# Patient Record
Sex: Female | Born: 1995 | Race: Black or African American | Hispanic: No | Marital: Single | Smoking: Never smoker
Health system: Southern US, Community
[De-identification: ages and names within clinical notes are randomized; demographics above are authoritative.]

## PROBLEM LIST (undated history)

## (undated) DIAGNOSIS — J45990 Exercise induced bronchospasm: Secondary | ICD-10-CM

## (undated) DIAGNOSIS — F419 Anxiety disorder, unspecified: Secondary | ICD-10-CM

## (undated) DIAGNOSIS — F410 Panic disorder [episodic paroxysmal anxiety] without agoraphobia: Secondary | ICD-10-CM

## (undated) HISTORY — PX: NO PAST SURGERIES: SHX2092

---
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
## 2013-04-14 ENCOUNTER — Ambulatory Visit: Payer: Self-pay

## 2013-04-14 LAB — URINALYSIS, COMPLETE
Bilirubin,UR: NEGATIVE
Glucose,UR: NEGATIVE mg/dL (ref 0–75)
Ketone: NEGATIVE
Nitrite: NEGATIVE
Ph: 6.5 (ref 4.5–8.0)
Specific Gravity: 1.01 (ref 1.003–1.030)
WBC UR: >30 /HPF (ref 0–5)

## 2013-04-14 LAB — PREGNANCY, URINE: Pregnancy Test, Urine: NEGATIVE m[IU]/mL

## 2013-04-16 LAB — URINE CULTURE

## 2013-04-30 ENCOUNTER — Emergency Department: Payer: Self-pay | Admitting: Emergency Medicine

## 2013-04-30 LAB — CBC
HCT: 37.7 % (ref 35.0–47.0)
MCH: 28.1 pg (ref 26.0–34.0)
MCHC: 34.9 g/dL (ref 32.0–36.0)
MCV: 81 fL (ref 80–100)
Platelet: 353 10*3/uL (ref 150–440)
RBC: 4.68 10*6/uL (ref 3.80–5.20)
RDW: 12.6 % (ref 11.5–14.5)

## 2013-04-30 LAB — COMPREHENSIVE METABOLIC PANEL
Albumin: 3.3 g/dL — ABNORMAL LOW (ref 3.8–5.6)
Anion Gap: 5 — ABNORMAL LOW (ref 7–16)
BUN: 7 mg/dL (ref 7–18)
Calcium, Total: 9.5 mg/dL (ref 9.0–10.7)
Chloride: 104 mmol/L (ref 98–107)
Co2: 29 mmol/L (ref 21–32)
EGFR (Non-African Amer.): >60
Glucose: 77 mg/dL (ref 65–99)
Osmolality: 272 (ref 275–301)
SGOT(AST): 17 U/L (ref 0–26)
Sodium: 138 mmol/L (ref 136–145)

## 2013-04-30 LAB — URINALYSIS, COMPLETE
Bacteria: NONE SEEN
Ketone: NEGATIVE
Nitrite: NEGATIVE
Protein: NEGATIVE
RBC,UR: 7 /HPF (ref 0–5)
Specific Gravity: 1.017 (ref 1.003–1.030)
Squamous Epithelial: 6
WBC UR: 9 /HPF (ref 0–5)

## 2013-04-30 LAB — LIPASE, BLOOD: Lipase: 129 U/L (ref 73–393)

## 2014-03-14 IMAGING — US ABDOMEN ULTRASOUND LIMITED
1 series · 14 of 25 positions shown · non-contrast
Comparison: none

REASON FOR EXAM: RUQ pain
COMMENTS:   Body Site: GB and Fossa, CBD, Head of Pancreas

[Series 1: abdomen ultrasound limited · 0.23mm/px · 14 of 51 slices shown]
[im 1/51]
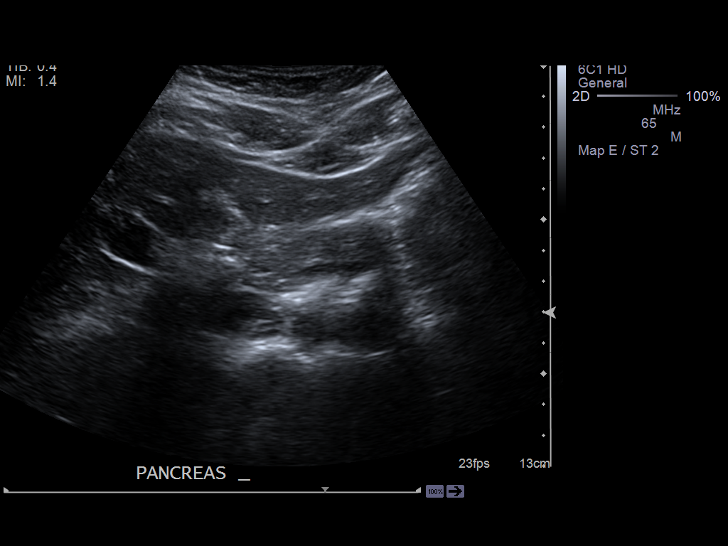
[im 5/51]
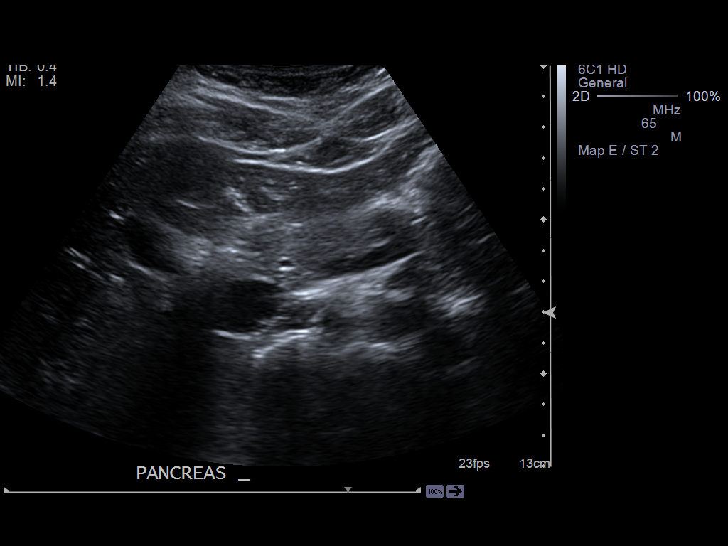
[im 9/51]
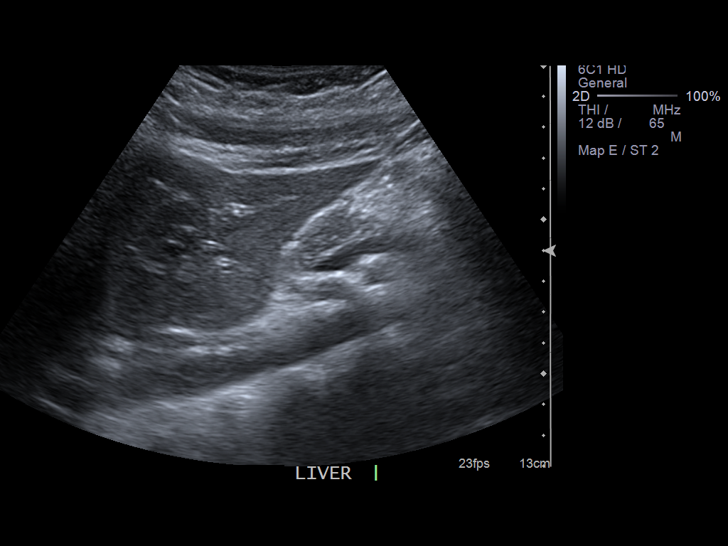
[im 13/51]
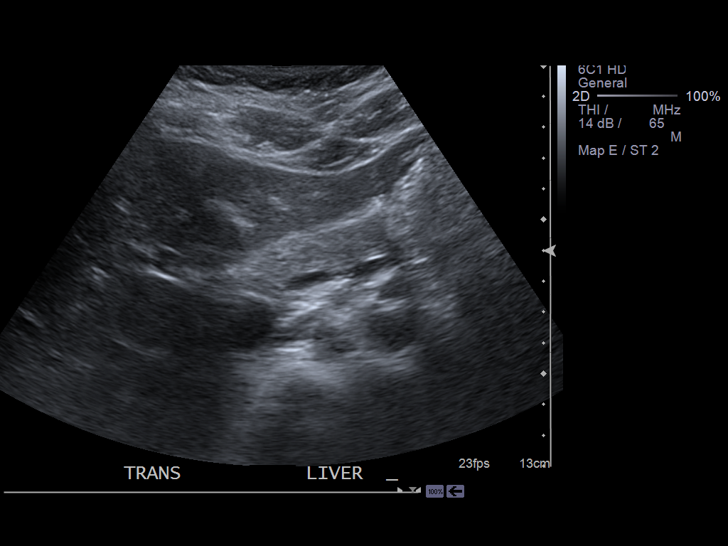
[im 17/51]
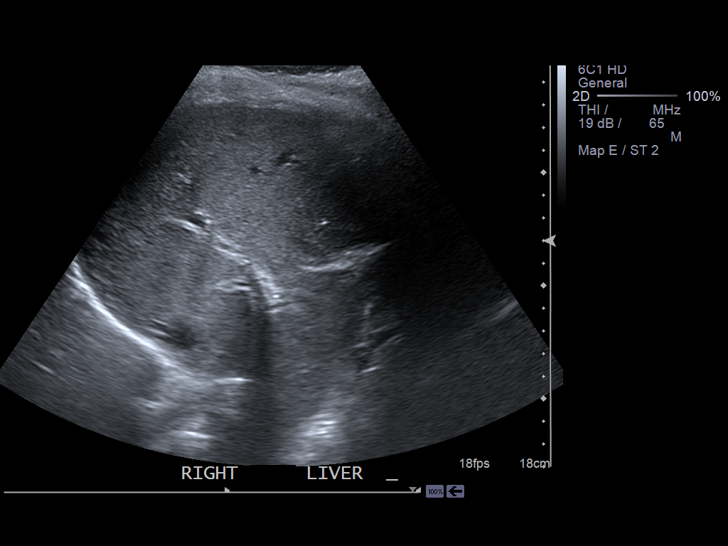
[im 19/51]
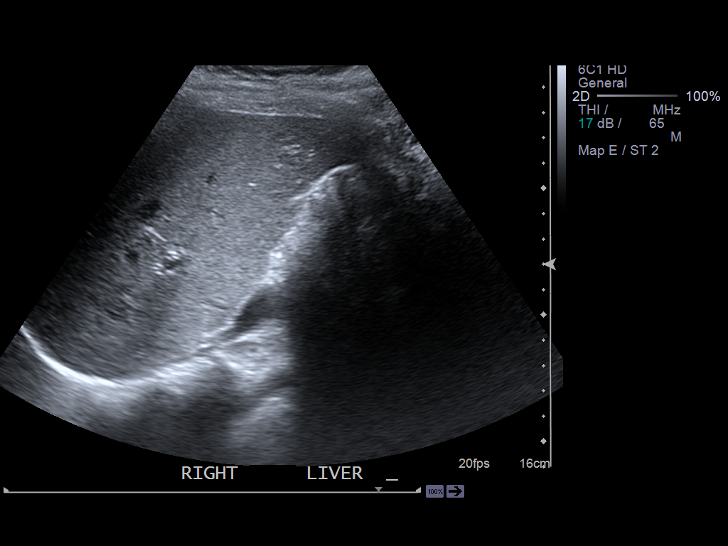
[im 23/51]
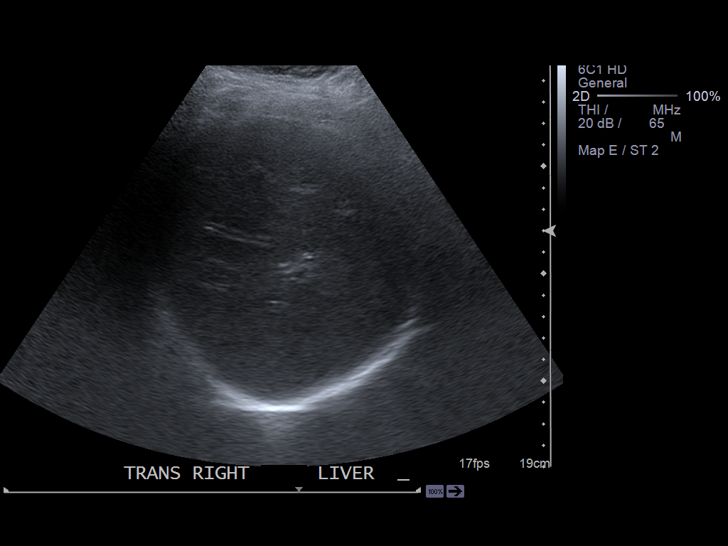
[im 28/51]
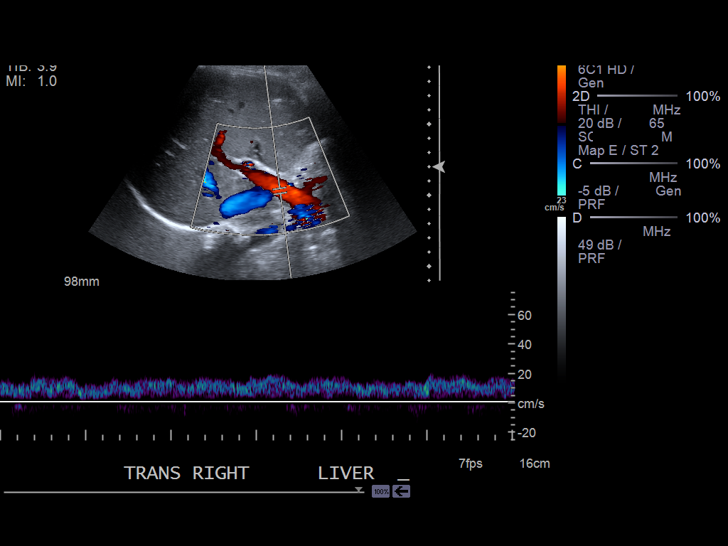
[im 32/51]
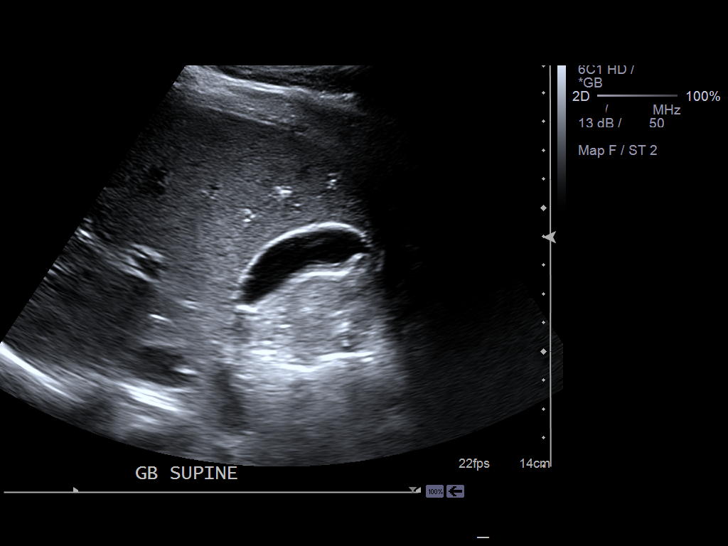
[im 34/51]
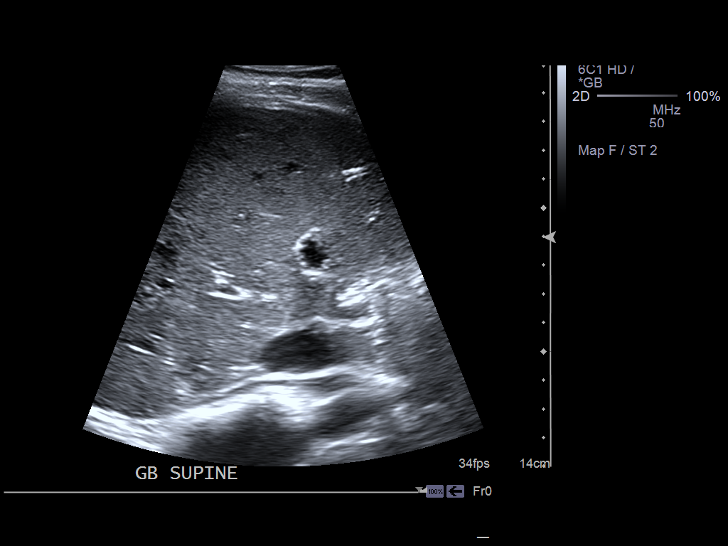
[im 38/51]
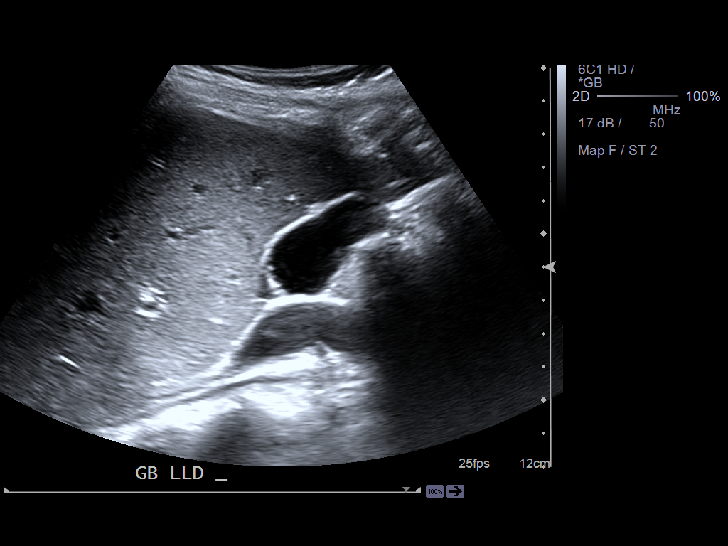
[im 42/51]
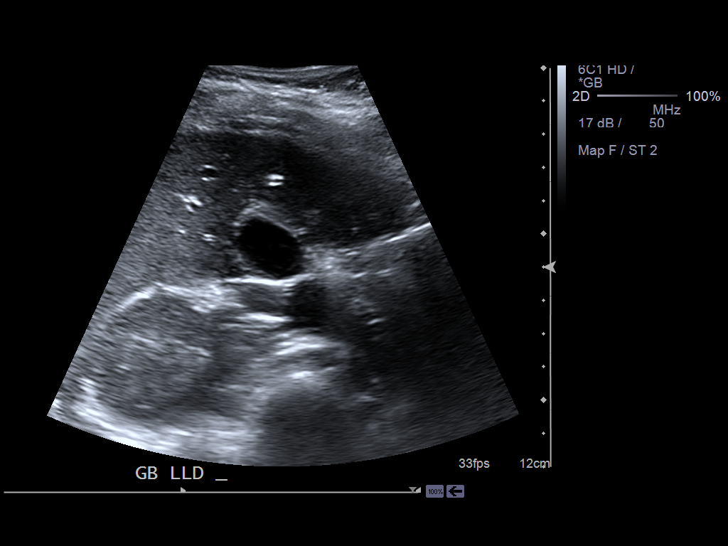
[im 46/51]
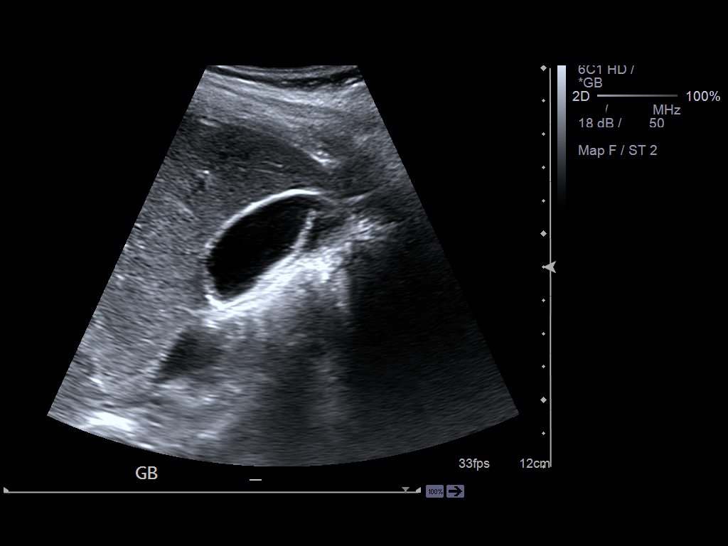
[im 51/51]
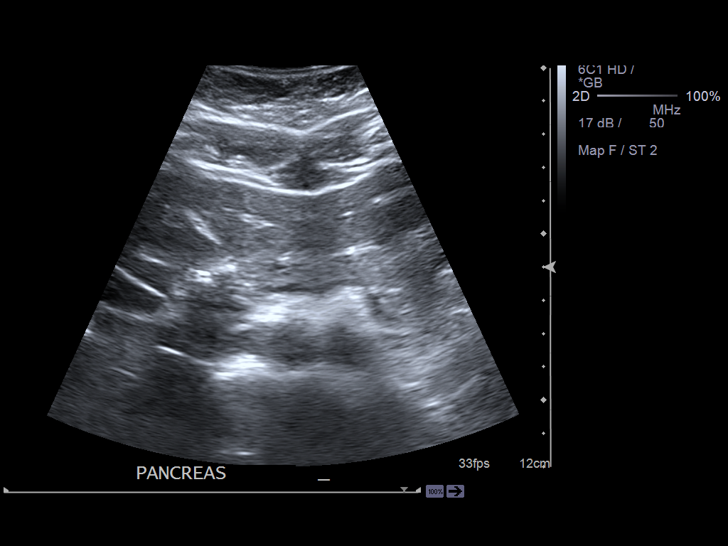

[14 of 25 positions shown; findings below may reference images not displayed]

PROCEDURE:     US  - US ABDOMEN LIMITED SURVEY  - April 30, 2013  [DATE]

RESULT:     The liver exhibits normal echotexture with no focal mass nor
ductal dilation. Portal venous flow is normal in direction toward the liver.
The pancreas exhibits normal echotexture and contour with no evidence of a
mass or ductal dilation. The gallbladder is adequately distended with no
evidence of stones, wall thickening, or pericholecystic fluid. There is no
positive sonographic Murphy's sign. The common bile duct is normal at 2.1 mm
in diameter.
IMPRESSION: Normal limited exam of the liver, pancreas, gallbladder,
and common bile duct.

[REDACTED]

## 2016-11-07 ENCOUNTER — Ambulatory Visit
Admission: EM | Admit: 2016-11-07 | Discharge: 2016-11-07 | Disposition: A | Discharge status: Home / Self Care | Payer: 59 | Attending: Family Medicine | Admitting: Family Medicine

## 2016-11-07 DIAGNOSIS — K5900 Constipation, unspecified: Secondary | ICD-10-CM

## 2016-11-07 MED ORDER — DOCUSATE SODIUM 100 MG PO CAPS: 100.0000 mg | ORAL_CAPSULE | Freq: Two times a day (BID) | ORAL | 0 refills | Status: AC

## 2016-11-07 NOTE — Discharge Instructions (Signed)
Take medication as prescribed. Take over the counter fiber/metamucil as discussed. Drink plenty of fluids.   Follow up with your primary care physician this week as needed. Return to Urgent care for new or worsening concerns.

## 2016-11-07 NOTE — ED Provider Notes (Signed)
[facility] ____________________________________________  Time seen: Approximately 9:59 AM  I have reviewed the triage vital signs and the nursing notes.   HISTORY  Chief Complaint Abdominal Pain  HPI Kaitlyn Fox is a 21 y.o. female  presenting for the complaints of abdominal discomfort and constipation. Patient reports yesterday morning she was having generalized abdominal pain, fullness and gas feeling that resolved after having a bowel movement. Patient reports yesterday evening she did have a bowel movement after straining for over an hour and taking milk of magnesia. Patient reports bowel movement was large, hard, normal coloration. Patient reports she has a history of chronic constipation with her bowel movement pattern every approximately 3 days, and reports this been her pattern since childhood. Patient reports never been previously evaluated the same complaint. Denies nausea vomiting or diarrhea. Reports continues to pass gas normally.   Patient reports after bowel movement her abdominal pain resolved except for having some pain to her right upper side, that is only present with movement. Patient reports straining during bowel movement. Patient does report yesterday after bowel movement when she wiped there was a small amount of bright red blood on the tissue, denies blood in stool or toiler or dark color stool. Patient reports last week she did have a bowel movement after straining that she noticed what appeared to be blood in the stool, denies black stool. Denies other blood in stool. Denies any other bleeding.  Denies any urinary frequency, urinary urgency or burning with urination. Denies vaginal discharge, vaginal odor or vaginal complaints. Reports sexually active with one partner, declines concerns of STDs. Patient reports that she did take over-the-counter milk of magnesia yesterday which assisted with bowel movement, denies taking other medications  over-the-counter. Reports continues to drink fluids well denies recent dietary changes.Reports has continued to remain active.   Denies chest pain, shortness of breath, dysuria, extremity pain, extremity swelling or rash. Denies recent sickness. Denies recent antibiotic use.   Patient's last menstrual period was 10/17/2016.Denies pregnancy  History reviewed. No pertinent past medical history.  Constipation  There are no active problems to display for this patient.   History reviewed. No pertinent surgical history.   No current facility-administered medications for this encounter.   Current Outpatient Prescriptions:  .  norethindrone (MICRONOR,CAMILA,ERRIN) 0.35 MG tablet, Take 1 tablet by mouth daily., Disp: , Rfl:  .  docusate sodium (COLACE) 100 MG capsule, Take 1 capsule (100 mg total) by mouth every 12 (twelve) hours., Disp: 20 capsule, Rfl: 0  Allergies Patient has no known allergies.  History reviewed. No pertinent family history.  Social History Social History  Substance Use Topics  . Smoking status: Never Smoker  . Smokeless tobacco: Never Used  . Alcohol use No    Review of Systems Constitutional: No fever/chills Eyes: No visual changes. ENT: No sore throat. Cardiovascular: Denies chest pain. Respiratory: Denies shortness of breath. Gastrointestinal: As above. No nausea, no vomiting.  No diarrhea.   Genitourinary: Negative for dysuria. Musculoskeletal: Negative for back pain. Skin: Negative for rash. Neurological: Negative for headaches, focal weakness or numbness.  10-point ROS otherwise negative.  ____________________________________________   PHYSICAL EXAM:  VITAL SIGNS: ED Triage Vitals  Enc Vitals Group     BP 11/07/16 0942 (!) 138/97     Pulse Rate 11/07/16 0942 86     Resp 11/07/16 0942 18     Temp 11/07/16 0942 98.7 F (37.1 C)     Temp Source 11/07/16 0942 Oral  SpO2 11/07/16 0942 100 %     Weight 11/07/16 0944 160 lb (72.6 kg)      Height 11/07/16 0944 5\' 4"  (1.626 m)     Head Circumference --      Peak Flow --      Pain Score 11/07/16 0946 6     Pain Loc --      Pain Edu? --      Excl. in GC? --     Constitutional: Alert and oriented. Well appearing and in no acute distress. Eyes: Conjunctivae are normal. PERRL. EOMI. ENT      Head: Normocephalic and atraumatic.      Mouth/Throat: Mucous membranes are moist.Oropharynx non-erythematous. Cardiovascular: Normal rate, regular rhythm. Grossly normal heart sounds.  Good peripheral circulation. Respiratory: Normal respiratory effort without tachypnea nor retractions. Breath sounds are clear and equal bilaterally. No wheezes, rales, rhonchi. Gastrointestinal: Soft and nontender. No distention. Normal Bowel sounds. No CVA tenderness. Musculoskeletal: Steady gait. No midline cervical, thoracic or lumbar tenderness to palpation. Right upper lateral side mild pain with twisting and overhead reaching, no pain when still, no pain with lying and palpated.  Neurologic:  Normal speech and language. Speech is normal. No gait instability.  Skin:  Skin is warm, dry and intact. No rash noted. Psychiatric: Mood and affect are normal. Speech and behavior are normal. Patient exhibits appropriate insight and judgment   ___________________________________________   LABS (all labs ordered are listed, but only abnormal results are displayed)  Labs Reviewed - No data to display ____________________________________________    PROCEDURES Procedures   INITIAL IMPRESSION / ASSESSMENT AND PLAN / ED COURSE  Pertinent labs & imaging results that were available during my care of the patient were reviewed by me and considered in my medical decision making (see chart for details).  Well-appearing patient. No acute distress. Presents for the complaints of what she described of about abdominal discomfort that is present yesterday and resolved with large bowel movement. In discussing with  patient, reports chronic history of constipation. Discussed supportive measures. Also suspect muscle strain, as pain only with movement and non at rest. Discussed the evaluation today, including up to KUB, labs, rectal exam. As good bowel sounds, and no abdominal pain at this time, defer KUB, patient agreed. Patient also declines lab studies and rectal exam at this time, and states would like to try medication and supportive treatment first and then follow up as needed. Discussed strict follow up and return parameters. Will treat patient with oral Colace, and discuss over-the-counter fiber supplements including Metamucil. Also discussed use of probiotics. Encourage plenty of water and food fiber sources.Discussed indication, risks and benefits of medications with patient.  Discussed follow up with Primary care physician this week. Discussed follow up and return parameters including no resolution or any worsening concerns. Patient verbalized understanding and agreed to plan.   ____________________________________________   FINAL CLINICAL IMPRESSION(S) / ED DIAGNOSES  Final diagnoses:  Constipation, unspecified constipation type     Discharge Medication List as of 11/07/2016 10:30 AM    START taking these medications   Details  docusate sodium (COLACE) 100 MG capsule Take 1 capsule (100 mg total) by mouth every 12 (twelve) hours., Starting Fri 11/07/2016, Until Mon 11/17/2016, Normal        Note: This dictation was prepared with Dragon dictation along with smaller phrase technology. Any transcriptional errors that result from this process are unintentional.         Renford Dills, NP 11/07/16 1121

## 2016-11-07 NOTE — ED Triage Notes (Signed)
Pt c/o stomach pains for the last couple of days. She mentions that she only has a bowel movement every 2-3 days however the last 2 days she has had a BM and they are painful and the stool itself is large and she has some bleeding with the BM. Everything she eats upsets her stomach. Some cramping throughout the day.

## 2017-01-17 ENCOUNTER — Ambulatory Visit
Admission: EM | Admit: 2017-01-17 | Discharge: 2017-01-17 | Disposition: A | Discharge status: Home / Self Care | Payer: 59 | Attending: Emergency Medicine | Admitting: Emergency Medicine

## 2017-01-17 ENCOUNTER — Encounter: Payer: Self-pay | Admitting: Emergency Medicine

## 2017-01-17 DIAGNOSIS — J4521 Mild intermittent asthma with (acute) exacerbation: Secondary | ICD-10-CM

## 2017-01-17 DIAGNOSIS — J40 Bronchitis, not specified as acute or chronic: Secondary | ICD-10-CM

## 2017-01-17 HISTORY — DX: Exercise induced bronchospasm: J45.990

## 2017-01-17 MED ORDER — ALBUTEROL SULFATE HFA 108 (90 BASE) MCG/ACT IN AERS: 2.0000 | INHALATION_SPRAY | RESPIRATORY_TRACT | 0 refills | Status: AC | PRN

## 2017-01-17 MED ORDER — PREDNISONE 10 MG PO TABS: 20.0000 mg | ORAL_TABLET | Freq: Every day | ORAL | 0 refills | Status: AC

## 2017-01-17 MED ORDER — FLUTICASONE PROPIONATE 50 MCG/ACT NA SUSP: 1.0000 | Freq: Every day | NASAL | 2 refills | Status: AC

## 2017-01-17 MED ORDER — BENZONATATE 100 MG PO CAPS: 100.0000 mg | ORAL_CAPSULE | Freq: Three times a day (TID) | ORAL | 0 refills | Status: AC

## 2017-01-17 NOTE — Discharge Instructions (Signed)
Rest,push fluids, take meds as directed. Follow up with PCP in 2-3 days if no improvement, sooner if worse. If you have worsening symptoms with treatment go to ER for further evaluation.

## 2017-01-17 NOTE — ED Provider Notes (Signed)
CSN: [id number]     Arrival date & time 01/17/17  4098 History   First MD Initiated Contact with Patient 01/17/17 [phone]     Chief Complaint  Patient presents with  . Shortness of Breath   (Consider location/radiation/quality/duration/timing/severity/associated sxs/prior Treatment) No language interpreter was used.  Cough  Cough characteristics:  Non-productive and dry Severity:  Mild Onset quality:  Sudden Duration:  3 days Timing:  Constant Progression:  Unchanged Chronicity:  Recurrent Smoker: no   Context: sick contacts, upper respiratory infection and weather changes   Relieved by:  Nothing Worsened by:  Environmental changes Ineffective treatments:  Rest Associated symptoms: shortness of breath, sinus congestion and wheezing   Associated symptoms: no chest pain, no chills, no ear pain, no fever, no headaches, no myalgias, no rash, no rhinorrhea and no sore throat   Associated symptoms comment:  Chest burning w deep cough inspiration Risk factors: recent infection     Past Medical History:  Diagnosis Date  . Exercise-induced asthma    Past Surgical History:  Procedure Laterality Date  . NO PAST SURGERIES     No family history on file. Social History  Substance Use Topics  . Smoking status: Never Smoker  . Smokeless tobacco: Never Used  . Alcohol use No   OB History    No data available     Review of Systems  Constitutional: Negative for chills and fever.  HENT: Positive for congestion, sinus pain and sinus pressure. Negative for ear pain, rhinorrhea, sore throat, trouble swallowing and voice change.   Eyes: Negative.   Respiratory: Positive for cough, shortness of breath and wheezing.   Cardiovascular: Negative for chest pain, palpitations and leg swelling.  Gastrointestinal: Negative for nausea and vomiting.  Endocrine: Negative.   Genitourinary: Negative for dysuria.  Musculoskeletal: Negative for myalgias.  Skin: Negative for rash.   Allergic/Immunologic: Positive for environmental allergies.  Neurological: Negative for headaches.  Hematological: Negative.   Psychiatric/Behavioral: Negative.   All other systems reviewed and are negative.   Allergies  Patient has no known allergies.  Home Medications   Prior to Admission medications   Medication Sig Start Date End Date Taking? Authorizing Provider  albuterol (PROVENTIL HFA;VENTOLIN HFA) 108 (90 Base) MCG/ACT inhaler Inhale 2 puffs into the lungs every 4 (four) hours as needed for wheezing or shortness of breath. 01/17/17   Kamden Reber, Para March, NP  benzonatate (TESSALON) 100 MG capsule Take 1 capsule (100 mg total) by mouth every 8 (eight) hours. 01/17/17   Tuwanda Vokes, Para March, NP  fluticasone (FLONASE) 50 MCG/ACT nasal spray Place 1 spray into both nostrils daily. 01/17/17   Marcel Gary, Para March, NP  norethindrone (MICRONOR,CAMILA,ERRIN) 0.35 MG tablet Take 1 tablet by mouth daily.    [provider]  predniSONE (DELTASONE) 10 MG tablet Take 2 tablets (20 mg total) by mouth daily with breakfast. X 5 days, Disp # 10, no refills 01/17/17   Nainika Newlun, Para March, NP   Meds Ordered and Administered this Visit  Medications - No data to display  BP 128/82 (BP Location: Left Arm)   Pulse 88   Temp 98.1 F (36.7 C) (Oral)   Resp 16   Ht 5\' 4"  (1.626 m)   Wt 168 lb (76.2 kg)   LMP 12/18/2016   SpO2 98%   BMI 28.84 kg/m  No data found.   Physical Exam  Constitutional: She is oriented to person, place, and time. Vital signs are normal. She appears well-developed and well-nourished. She is active and cooperative.  Non-toxic  appearance. She does not have a sickly appearance. She does not appear ill. No distress.  HENT:  Head: Normocephalic.  Right Ear: Tympanic membrane is retracted.  Left Ear: Tympanic membrane is retracted.  Nose: Mucosal edema present. No sinus tenderness.  Mouth/Throat: Oropharynx is clear and moist and mucous membranes are normal.  Eyes:  Conjunctivae, EOM and lids are normal. Pupils are equal, round, and reactive to light.  Neck: Normal range of motion. No tracheal deviation present.  Cardiovascular: Regular rhythm, normal heart sounds and normal pulses.   No murmur heard. Pulmonary/Chest: Effort normal and breath sounds normal.  Abdominal: Soft. Bowel sounds are normal. There is no tenderness.  Musculoskeletal: Normal range of motion.  Lymphadenopathy:    She has no cervical adenopathy.  Neurological: She is alert and oriented to person, place, and time. GCS eye subscore is 4. GCS verbal subscore is 5. GCS motor subscore is 6.  Skin: Skin is warm, dry and intact. No rash noted.  Psychiatric: She has a normal mood and affect. Her speech is normal and behavior is normal. Thought content normal.  Nursing note and vitals reviewed.   Urgent Care Course     Procedures (including critical care time)  Labs Review Labs Reviewed - No data to display  Imaging Review No results found.      MDM   1. Bronchitis   2. Mild intermittent asthma with exacerbation     Rest,push fluids, take meds as directed(pred burst, fluticasone, tessalon). Follow up with PCP in 2-3 days if no improvement, sooner if worse. If you have worsening symptoms with treatment go to ER for further evaluation. Pt verbalized understanding to this provider.    Clancy Gourdefelice, Talyssa Gibas, NP 01/17/17 [phone]

## 2017-01-17 NOTE — ED Triage Notes (Signed)
Shortness of breath, wheezing, chest discomfort for 3 days

## 2017-07-12 ENCOUNTER — Ambulatory Visit (HOSPITAL_COMMUNITY)
Admission: RE | Admit: 2017-07-12 | Discharge: 2017-07-12 | Disposition: A | Discharge status: Home / Self Care | Payer: 59 | Attending: Psychiatry | Admitting: Psychiatry

## 2017-07-12 DIAGNOSIS — F321 Major depressive disorder, single episode, moderate: Secondary | ICD-10-CM | POA: Diagnosis present

## 2017-07-12 NOTE — H&P (Signed)
Behavioral Health Medical Screening Exam  Kaitlyn Fox is an 21 y.o. female [facility] with c/o depression and passive suicide ideations without plans. Patient stating that she will not do anything to hurt her self. She is depressed over moving back in with parents. Described relationship with parent as chronically rocky. Denies any active SI/HI/VAH at the moment, reported being started on Wellbutrin by PCP about 3 weeks ago but stating not effective yet.    Total Time spent with patient: 30 minutes  Psychiatric Specialty Exam: Physical Exam  Vitals reviewed. Constitutional: She is oriented to person, place, and time. She appears well-developed and well-nourished.  HENT:  Head: Normocephalic and atraumatic.  Eyes: Pupils are equal, round, and reactive to light.  Neck: Normal range of motion.  Cardiovascular: Normal rate, regular rhythm and normal heart sounds.  Respiratory: Effort normal and breath sounds normal.  GI: Soft. Bowel sounds are normal.  Musculoskeletal: Normal range of motion.  Neurological: She is alert and oriented to person, place, and time.  Skin: Skin is warm and dry.    Review of Systems  Psychiatric/Behavioral: Positive for depression and suicidal ideas (passive). Negative for hallucinations, memory loss and substance abuse. The patient is not nervous/anxious and does not have insomnia.   All other systems reviewed and are negative.   Blood pressure (!) 139/93, pulse (!) 101, temperature 98.5 F (36.9 C), temperature source Oral, resp. rate 18, SpO2 100 %.There is no height or weight on file to calculate BMI.  General Appearance: Casual and Well Groomed  Eye Contact:  Good  Speech:  Clear and Coherent and Normal Rate  Volume:  Normal  Mood:  ambivalent  Affect:  Congruent  Thought Process:  Coherent and Goal Directed  Orientation:  Full (Time, Place, and Person)  Thought Content:  WDL and Logical  Suicidal Thoughts:  No   Homicidal Thoughts:  No  Memory:  Immediate;   Good Recent;   Good Remote;   Fair  Judgement:  Good  Insight:  Good  Psychomotor Activity:  Normal  Concentration: Concentration: Good and Attention Span: Good  Recall:  Good  Fund of Knowledge:Good  Language: Good  Akathisia:  Negative  Handed:  Right  AIMS (if indicated):     Assets:  Communication Skills Desire for Improvement Financial Resources/Insurance Housing Leisure Time Physical Health Resilience Social Support Talents/Skills  Sleep:       Musculoskeletal: Strength & Muscle Tone: within normal limits Gait & Station: normal Patient leans: N/A  Blood pressure (!) 139/93, pulse (!) 101, temperature 98.5 F (36.9 C), temperature source Oral, resp. rate 18, SpO2 100 %.  Recommendations:  Based on my evaluation the patient does not appear to have an emergency medical condition.  Delila PereyraJustina A Kerri Kovacik, NP 07/12/2017, 2:50 PM
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
## 2019-09-03 ENCOUNTER — Emergency Department: Payer: BC Managed Care – PPO

## 2019-09-03 ENCOUNTER — Encounter: Payer: Self-pay | Admitting: Emergency Medicine

## 2019-09-03 ENCOUNTER — Other Ambulatory Visit: Payer: Self-pay

## 2019-09-03 ENCOUNTER — Emergency Department
Admission: EM | Admit: 2019-09-03 | Discharge: 2019-09-03 | Disposition: A | Discharge status: Home / Self Care | Payer: BC Managed Care – PPO | Attending: Emergency Medicine | Admitting: Emergency Medicine

## 2019-09-03 DIAGNOSIS — Z79899 Other long term (current) drug therapy: Secondary | ICD-10-CM | POA: Insufficient documentation

## 2019-09-03 DIAGNOSIS — R0789 Other chest pain: Secondary | ICD-10-CM | POA: Insufficient documentation

## 2019-09-03 DIAGNOSIS — J45909 Unspecified asthma, uncomplicated: Secondary | ICD-10-CM | POA: Insufficient documentation

## 2019-09-03 DIAGNOSIS — F410 Panic disorder [episodic paroxysmal anxiety] without agoraphobia: Secondary | ICD-10-CM | POA: Insufficient documentation

## 2019-09-03 HISTORY — DX: Anxiety disorder, unspecified: F41.9

## 2019-09-03 HISTORY — DX: Panic disorder (episodic paroxysmal anxiety): F41.0

## 2019-09-03 LAB — BASIC METABOLIC PANEL
Anion gap: 8 (ref 5–15)
BUN: 7 mg/dL (ref 6–20)
CO2: 27 mmol/L (ref 22–32)
Calcium: 9.4 mg/dL (ref 8.9–10.3)
Chloride: 104 mmol/L (ref 98–111)
Creatinine, Ser: 0.77 mg/dL (ref 0.44–1.00)
GFR calc Af Amer: >60 mL/min (ref >60)
GFR calc non Af Amer: >60 mL/min (ref >60)
Glucose, Bld: 101 mg/dL — ABNORMAL HIGH (ref 70–99)
Potassium: 3.6 mmol/L (ref 3.5–5.1)
Sodium: 139 mmol/L (ref 135–145)

## 2019-09-03 LAB — CBC
HCT: 40.4 % (ref 36.0–46.0)
Hemoglobin: 14 g/dL (ref 12.0–15.0)
MCH: 27.5 pg (ref 26.0–34.0)
MCHC: 34.7 g/dL (ref 30.0–36.0)
MCV: 79.4 fL — ABNORMAL LOW (ref 80.0–100.0)
Platelets: 378 10*3/uL (ref 150–400)
RBC: 5.09 MIL/uL (ref 3.87–5.11)
RDW: 12.6 % (ref 11.5–15.5)
WBC: 8.9 10*3/uL (ref 4.0–10.5)
nRBC: 0 % (ref 0.0–0.2)

## 2019-09-03 LAB — TROPONIN I (HIGH SENSITIVITY): Troponin I (High Sensitivity): <2 ng/L (ref <18)

## 2019-09-03 NOTE — Discharge Instructions (Addendum)
You have been seen in the Emergency Department (ED) today for chest pain.  As we have discussed today?s test results are normal, and we feel it is likely that panic attacks may be causing your symptoms. ° °Please follow up with the recommended doctor as instructed above in these documents regarding today?s emergent visit and your recent symptoms to discuss further management.  Continue to take your regular medications. If you are not doing so already, consider taking a daily baby aspirin (81 mg), at least until you follow up with your doctor. ° °Return to the Emergency Department (ED) if you experience any further chest pain/pressure/tightness, difficulty breathing, or sudden sweating, or other symptoms that concern you. °

## 2019-09-03 NOTE — ED Triage Notes (Signed)
Patient reports she woke up feeling like she was choking.  Reports then had a panic attack.  Reports came to get checked because her chest hurts.

## 2019-09-03 NOTE — ED Provider Notes (Signed)
[facility] Emergency Department Provider Note  ____________________________________________   First MD Initiated Contact with Patient 09/03/19 [phone]     (approximate)  I have reviewed the triage vital signs and the nursing notes.   HISTORY  Chief Complaint Panic Attack and Chest Pain    HPI Kaitlyn Fox is a 24 y.o. female with medical history as listed below who presents for evaluation of some chest discomfort and shortness of breath associated with a panic attack.  She said that she feels like she has been a little bit "off" for about 2 months with increased anxiety.  She used to take Wellbutrin but went off of it after discussing it with her primary care doctor because she did not like the side effects.  However she has been considering starting back on something for some persistent anxiety issues.  She has not had a panic attack for about a year.  She said that she was more anxious all throughout the day yesterday.  She was sleeping and awoke feeling like she was choking and very anxious.  She has some discomfort in her chest and some shortness of breath.  The onset was very acute and was severe and nothing in particular made it better or worse although she felt better on her own.  However given her persistent discomfort and the onset and severity of the episode she wanted to get checked out to make sure everything was okay.  She has not been in contact with anyone known to have COVID-19.  She has no history of blood clots in the legs of the lungs.  She is not a smoker.  She takes birth control pills.  She denies sore throat, loss of smell and taste, cough, chest pain or shortness of breath other than the episode described above, abdominal pain, nausea, vomiting, and dysuria.        Past Medical History:  Diagnosis Date  . Anxiety   . Exercise-induced asthma   . Panic attacks     There are no problems to display for this patient.   Past Surgical  History:  Procedure Laterality Date  . NO PAST SURGERIES      Prior to Admission medications   Medication Sig Start Date End Date Taking? Authorizing Provider  albuterol (PROVENTIL HFA;VENTOLIN HFA) 108 (90 Base) MCG/ACT inhaler Inhale 2 puffs into the lungs every 4 (four) hours as needed for wheezing or shortness of breath. 3/38/25   Defelice, Jeanett Schlein, NP  benzonatate (TESSALON) 100 MG capsule Take 1 capsule (100 mg total) by mouth every 8 (eight) hours. 0/53/97   Defelice, Jeanett Schlein, NP  fluticasone (FLONASE) 50 MCG/ACT nasal spray Place 1 spray into both nostrils daily. 6/73/41   Defelice, Jeanett Schlein, NP  norethindrone (MICRONOR,CAMILA,ERRIN) 0.35 MG tablet Take 1 tablet by mouth daily.    [provider]  predniSONE (DELTASONE) 10 MG tablet Take 2 tablets (20 mg total) by mouth daily with breakfast. X 5 days, Disp # 10, no refills 9/37/90   Defelice, Jeanett Schlein, NP    Allergies Patient has no known allergies.  History reviewed. No pertinent family history.  Social History Social History   Tobacco Use  . Smoking status: Never Smoker  . Smokeless tobacco: Never Used  Substance Use Topics  . Alcohol use: No  . Drug use: No    Review of Systems Constitutional: No fever/chills Eyes: No visual changes. ENT: No sore throat. Cardiovascular: Some chest pain associated with presumed panic attack Respiratory: Some shortness of  breath associated with presumed panic attack Gastrointestinal: No abdominal pain.  No nausea, no vomiting.  No diarrhea.  No constipation. Genitourinary: Negative for dysuria. Musculoskeletal: Negative for neck pain.  Negative for back pain. Integumentary: Negative for rash. Neurological: Negative for headaches, focal weakness or numbness.   ____________________________________________   PHYSICAL EXAM:  VITAL SIGNS: ED Triage Vitals  Enc Vitals Group     BP 09/03/19 0207 (!) 146/99     Pulse Rate 09/03/19 0207 91     Resp 09/03/19 0207 17      Temp 09/03/19 0207 98.3 F (36.8 C)     Temp Source 09/03/19 0207 Oral     SpO2 09/03/19 0207 100 %     Weight 09/03/19 0208 83.9 kg (185 lb)     Height 09/03/19 0208 1.626 m (5\' 4" )     Head Circumference --      Peak Flow --      Pain Score 09/03/19 0208 4     Pain Loc --      Pain Edu? --      Excl. in GC? --     Constitutional: Alert and oriented.  Well-appearing, no acute distress, calm and cooperative, appropriately interactive. Eyes: Conjunctivae are normal.  Head: Atraumatic. Nose: No congestion/rhinnorhea. Mouth/Throat: Patient is wearing a mask. Neck: No stridor.  No meningeal signs.   Cardiovascular: Normal rate, regular rhythm. Good peripheral circulation. Grossly normal heart sounds. Respiratory: Normal respiratory effort.  No retractions. Gastrointestinal: Soft and nontender. No distention.  Musculoskeletal: No lower extremity tenderness nor edema. No gross deformities of extremities. Neurologic:  Normal speech and language. No gross focal neurologic deficits are appreciated.  Skin:  Skin is warm, dry and intact. Psychiatric: Mood and affect are normal. Speech and behavior are normal.  ____________________________________________   LABS (all labs ordered are listed, but only abnormal results are displayed)  Labs Reviewed  BASIC METABOLIC PANEL - Abnormal; Notable for the following components:      Result Value   Glucose, Bld 101 (*)    All other components within normal limits  CBC - Abnormal; Notable for the following components:   MCV 79.4 (*)    All other components within normal limits  POC URINE PREG, ED  TROPONIN I (HIGH SENSITIVITY)   ____________________________________________  EKG  ED ECG REPORT I, 11/01/19, the attending physician, personally viewed and interpreted this ECG.  Date: 09/03/2019 EKG Time: 2:09 AM Rate: 88 Rhythm: Sinus rhythm with marked sinus arrhythmia, otherwise normal QRS Axis: normal Intervals: normal ST/T Wave  abnormalities: normal Narrative Interpretation: no evidence of acute ischemia  ____________________________________________  RADIOLOGY I, 11/01/2019, personally viewed and evaluated these images (plain radiographs) as part of my medical decision making, as well as reviewing the written report by the radiologist.  ED MD interpretation: No indication of acute abnormality on chest x-ray  Official radiology report(s): DG Chest 2 View  Result Date: 09/03/2019 CLINICAL DATA:  Chest pain EXAM: CHEST - 2 VIEW COMPARISON:  None. FINDINGS: The heart size and mediastinal contours are within normal limits. Both lungs are clear. The visualized skeletal structures are unremarkable. IMPRESSION: No active cardiopulmonary disease. Electronically Signed   By: 11/01/2019 M.D.   On: 09/03/2019 02:29    ____________________________________________   PROCEDURES   Procedure(s) performed (including Critical Care):  Procedures   ____________________________________________   INITIAL IMPRESSION / MDM / ASSESSMENT AND PLAN / ED COURSE  As part of my medical decision making, I reviewed the following data  within the electronic MEDICAL RECORD NUMBER Nursing notes reviewed and incorporated, Labs reviewed , EKG interpreted , Old chart reviewed, Radiograph reviewed , Notes from prior ED visits and [place] Controlled Substance Database   Differential diagnosis includes, but is not limited to, panic attack/anxiety, PE, Communicare pneumonia, COVID-19, ACS.  Patient is well-appearing and in no distress.  She may not qualify for the [facility] criteria given the use of birth control pills, but she has a well score for PE of 0 and she would be PERC negative in the absence of the issue of exogenous estrogen.  She says that it did feel similar to prior episodes of panic attacks but her anxiety has been well controlled for extended period of time so she feels she should be checked out.  She has been asymptomatic since coming to the  ED.  High-sensitivity troponin is less than 2, basic metabolic panel normal, CBC normal.  Low risk for ACS based on HEAR score.  The patient is comfortable with the plan for discharge and outpatient follow-up with her primary care doctor to discuss her symptoms and any pharmacotherapy that may be beneficial.  I gave my usual customary return precautions.  No evidence of an emergent medical condition at this time.      Clinical Course as of Sep 02 544  Sat Sep 03, 2019  0453 DG Chest 2 View [CF]    Clinical Course User Index [CF] Loleta Rose, MD     ____________________________________________  FINAL CLINICAL IMPRESSION(S) / ED DIAGNOSES  Final diagnoses:  Panic attack  Atypical chest pain     MEDICATIONS GIVEN DURING THIS VISIT:  Medications - No data to display   ED Discharge Orders    None      *Please note:  SYLVIA HELMS was evaluated in Emergency Department on 09/03/2019 for the symptoms described in the history of present illness. She was evaluated in the context of the global COVID-19 pandemic, which necessitated consideration that the patient might be at risk for infection with the SARS-CoV-2 virus that causes COVID-19. Institutional protocols and algorithms that pertain to the evaluation of patients at risk for COVID-19 are in a state of rapid change based on information released by regulatory bodies including the CDC and federal and state organizations. These policies and algorithms were followed during the patient's care in the ED.  Some ED evaluations and interventions may be delayed as a result of limited staffing during the pandemic.*  Note:  This document was prepared using Dragon voice recognition software and may include unintentional dictation errors.   Loleta Rose, MD 09/03/19 [phone]

## 2022-10-16 ENCOUNTER — Ambulatory Visit
Admission: EM | Admit: 2022-10-16 | Discharge: 2022-10-16 | Discharge status: Against Medical Advice | Payer: 59 | Attending: Family Medicine | Admitting: Family Medicine

## 2022-10-16 DIAGNOSIS — Z5321 Procedure and treatment not carried out due to patient leaving prior to being seen by health care provider: Secondary | ICD-10-CM | POA: Insufficient documentation

## 2022-10-16 LAB — GROUP A STREP BY PCR: Group A Strep by PCR: NOT DETECTED

## 2022-10-16 NOTE — ED Notes (Signed)
Pt eloped and left without being seen by provider after being triaged.

## 2022-10-16 NOTE — ED Triage Notes (Signed)
Pt reports sensation of "something stuck in throat" x3-4 days pt states she thought it was indigestion. Pt denies any trouble breathing, pt states she was been going to the gym, just fine.

## 2022-10-16 NOTE — ED Provider Notes (Signed)
Pt left without being seen by a provider.    Lyndee Hensen, DO 10/16/22 2038

## 2023-07-22 ENCOUNTER — Ambulatory Visit
Admission: EM | Admit: 2023-07-22 | Discharge: 2023-07-22 | Disposition: A | Discharge status: Home / Self Care | Payer: 59 | Attending: Emergency Medicine | Admitting: Emergency Medicine

## 2023-07-22 DIAGNOSIS — J45990 Exercise induced bronchospasm: Secondary | ICD-10-CM | POA: Diagnosis not present

## 2023-07-22 DIAGNOSIS — T8090XA Unspecified complication following infusion and therapeutic injection, initial encounter: Secondary | ICD-10-CM | POA: Diagnosis not present

## 2023-07-22 DIAGNOSIS — J069 Acute upper respiratory infection, unspecified: Secondary | ICD-10-CM

## 2023-07-22 DIAGNOSIS — T8090XS Unspecified complication following infusion and therapeutic injection, sequela: Secondary | ICD-10-CM | POA: Diagnosis not present

## 2023-07-22 DIAGNOSIS — X58XXXA Exposure to other specified factors, initial encounter: Secondary | ICD-10-CM | POA: Diagnosis not present

## 2023-07-22 DIAGNOSIS — B9789 Other viral agents as the cause of diseases classified elsewhere: Secondary | ICD-10-CM | POA: Diagnosis not present

## 2023-07-22 DIAGNOSIS — F419 Anxiety disorder, unspecified: Secondary | ICD-10-CM | POA: Insufficient documentation

## 2023-07-22 LAB — RESP PANEL BY RT-PCR (FLU A&B, COVID) ARPGX2
Influenza A by PCR: NEGATIVE
Influenza B by PCR: NEGATIVE
SARS Coronavirus 2 by RT PCR: NEGATIVE

## 2023-07-22 LAB — GROUP A STREP BY PCR: Group A Strep by PCR: NOT DETECTED

## 2023-07-22 MED ORDER — IPRATROPIUM BROMIDE 0.06 % NA SOLN: 2.0000 | Freq: Four times a day (QID) | NASAL | 12 refills | Status: AC

## 2023-07-22 NOTE — ED Provider Notes (Addendum)
[facility]    CSN: [id number] Arrival date & time: 07/22/23  1436      History   Chief Complaint Chief Complaint  Patient presents with   Sore Throat   Nasal Congestion   Rash    HPI Kaitlyn Fox is a 27 y.o. female.   HPI  27 year old female with past medical history significant for anxiety, exercise-induced asthma, and panic attacks presents for evaluation of 2 separate complaints.  She received a COVID and flu immunization on 06/21/2023 and her right upper arm that resulted in a red itchy rash at the site of injection.  She continues to have the itchiness and she would like it to be evaluated.  Additionally, 2 days ago she developed a headache, nasal congestion, and a sore throat.  She denies any fever, nasal discharge, or cough.  Past Medical History:  Diagnosis Date   Anxiety    Exercise-induced asthma    Panic attacks     There are no problems to display for this patient.   Past Surgical History:  Procedure Laterality Date   NO PAST SURGERIES      OB History   No obstetric history on file.      Home Medications    Prior to Admission medications   Medication Sig Start Date End Date Taking? Authorizing Provider  ipratropium (ATROVENT) 0.06 % nasal spray Place 2 sprays into both nostrils 4 (four) times daily. 07/22/23  Yes Becky Augusta, NP  sertraline (ZOLOFT) 50 MG tablet Take 50 mg by mouth at bedtime. 07/09/23  Yes [provider]  albuterol (PROVENTIL HFA;VENTOLIN HFA) 108 (90 Base) MCG/ACT inhaler Inhale 2 puffs into the lungs every 4 (four) hours as needed for wheezing or shortness of breath. 01/17/17   Defelice, Para March, NP  benzonatate (TESSALON) 100 MG capsule Take 1 capsule (100 mg total) by mouth every 8 (eight) hours. 01/17/17   Defelice, Para March, NP  busPIRone (BUSPAR) 5 MG tablet Take 1 tablet by mouth 2 (two) times daily. 04/21/22   [provider]  fluticasone (FLONASE) 50 MCG/ACT nasal spray Place 1 spray into  both nostrils daily. 01/17/17   Defelice, Para March, NP  norethindrone (MICRONOR,CAMILA,ERRIN) 0.35 MG tablet Take 1 tablet by mouth daily.    [provider]  predniSONE (DELTASONE) 10 MG tablet Take 2 tablets (20 mg total) by mouth daily with breakfast. X 5 days, Disp # 10, no refills 01/17/17   Defelice, Para March, NP    Family History History reviewed. No pertinent family history.  Social History Social History   Tobacco Use   Smoking status: Never   Smokeless tobacco: Never  Vaping Use   Vaping status: Never Used  Substance Use Topics   Alcohol use: No   Drug use: No     Allergies   Patient has no known allergies.   Review of Systems Review of Systems  Constitutional:  Negative for fever.  HENT:  Positive for congestion and sore throat. Negative for ear pain and rhinorrhea.   Respiratory:  Negative for cough.   Skin:  Positive for rash.     Physical Exam Triage Vital Signs ED Triage Vitals  Encounter Vitals Group     BP      Systolic BP Percentile      Diastolic BP Percentile      Pulse      Resp      Temp      Temp src      SpO2  Weight      Height      Head Circumference      Peak Flow      Pain Score      Pain Loc      Pain Education      Exclude from Growth Chart    No data found.  Updated Vital Signs BP 132/85 (BP Location: Left Arm)   Pulse (!) 105   Temp 98.9 F (37.2 C) (Oral)   Resp 19   LMP 07/18/2023 (Approximate)   SpO2 99%   Visual Acuity Right Eye Distance:   Left Eye Distance:   Bilateral Distance:    Right Eye Near:   Left Eye Near:    Bilateral Near:     Physical Exam Vitals and nursing note reviewed.  Constitutional:      Appearance: Normal appearance. She is not ill-appearing.  HENT:     Head: Normocephalic and atraumatic.     Right Ear: Tympanic membrane, ear canal and external ear normal. There is no impacted cerumen.     Left Ear: Tympanic membrane, ear canal and external ear normal. There is no  impacted cerumen.     Nose: Congestion and rhinorrhea present.     Comments: Nasal mucosa is erythematous and edematous with scant clear discharge in both nares.    Mouth/Throat:     Mouth: Mucous membranes are moist.     Pharynx: Oropharynx is clear. Posterior oropharyngeal erythema present. No oropharyngeal exudate.     Comments: Bilateral tonsillar pillars are erythematous and edematous but free of exudate.  Posterior oropharynx demonstrates erythema with clear postnasal drip. Cardiovascular:     Rate and Rhythm: Normal rate and regular rhythm.     Pulses: Normal pulses.     Heart sounds: Normal heart sounds. No murmur heard.    No friction rub. No gallop.  Pulmonary:     Effort: Pulmonary effort is normal.     Breath sounds: Normal breath sounds. No wheezing, rhonchi or rales.  Musculoskeletal:     Cervical back: Normal range of motion and neck supple. No tenderness.  Lymphadenopathy:     Cervical: No cervical adenopathy.  Skin:    General: Skin is warm and dry.     Capillary Refill: Capillary refill takes less than 2 seconds.     Findings: Bruising present.  Neurological:     General: No focal deficit present.     Mental Status: She is alert and oriented to person, place, and time.      UC Treatments / Results  Labs (all labs ordered are listed, but only abnormal results are displayed) Labs Reviewed  GROUP A STREP BY PCR  RESP PANEL BY RT-PCR (FLU A&B, COVID) [id number]    EKG   Radiology No results found.  Procedures Procedures (including critical care time)  Medications Ordered in UC Medications - No data to display  Initial Impression / Assessment and Plan / UC Course  I have reviewed the triage vital signs and the nursing notes.  Pertinent labs & imaging results that were available during my care of the patient were reviewed by me and considered in my medical decision making (see chart for details).   Patient is a pleasant, nontoxic-appearing 27 year old  female presenting for evaluation of URI symptoms that started 2 days ago.  She is free of fever, cough, or nasal discharge.  On exam she does have inflamed nasal mucosa with clear rhinorrhea and edematous and erythematous tonsillar pillars.  I will  order a COVID and flu PCR along with a strep PCR to evaluate for the presence of all 3.  With regards to the rash on the patient's right upper arm.  As you can see in image above, there is a small area of hyperpigmentation that appears more erythematous in image than it does in the exam room.  In the exam room it has more of a resolving bruise appearance.  There is no induration or fluctuance and it is not hot or tender to the touch.  I suspect that this is most likely the normal healing process from her immunization reaction.  Strep PCR is negative.  Respiratory panel is negative for COVID or influenza.  I will discharge patient home with a diagnosis of viral URI and prescribe Atrovent nasal spray to help with the congestion.  Use over-the-counter Tylenol and ibuprofen as needed for fever or pain.  Salt water gargles and over-the-counter Chloraseptic or Sucrets lozenges as needed for sore throat.  Return precautions reviewed.  She can use over-the-counter Benadryl cream as needed to help with itching at the site of her injection reaction.  Final Clinical Impressions(s) / UC Diagnoses   Final diagnoses:  Viral upper respiratory tract infection  Injection site reaction, sequela     Discharge Instructions      Your testing today for strep, COVID, and influenza were all negative.  I do believe you have a viral respiratory infection which is causing your symptoms.  Use over-the-counter Tylenol and/or ibuprofen according the package instructions as needed for any pain you may experience, or if you develop a fever.  Use the Atrovent nasal spray, 2 squirts of each nostril every 6 hours, as needed for nasal congestion or runny nose.  You may use salt  water gargles or over-the-counter Chloraseptic or Sucrets lozenges, help soothe your sore throat pain.  You may use topical Benadryl cream to help alleviate itching at the site of your injection reaction.  If you develop any new or worsening symptoms please return for reevaluation or see your primary care provider.     ED Prescriptions     Medication Sig Dispense Auth. Provider   ipratropium (ATROVENT) 0.06 % nasal spray Place 2 sprays into both nostrils 4 (four) times daily. 15 mL Becky Augusta, NP      PDMP not reviewed this encounter.   Becky Augusta, NP 07/22/23 1540    Becky Augusta, NP 07/22/23 [phone]

## 2023-07-22 NOTE — Discharge Instructions (Addendum)
Your testing today for strep, COVID, and influenza were all negative.  I do believe you have a viral respiratory infection which is causing your symptoms.  Use over-the-counter Tylenol and/or ibuprofen according the package instructions as needed for any pain you may experience, or if you develop a fever.  Use the Atrovent nasal spray, 2 squirts of each nostril every 6 hours, as needed for nasal congestion or runny nose.  You may use salt water gargles or over-the-counter Chloraseptic or Sucrets lozenges, help soothe your sore throat pain.  You may use topical Benadryl cream to help alleviate itching at the site of your injection reaction.  If you develop any new or worsening symptoms please return for reevaluation or see your primary care provider.

## 2023-07-22 NOTE — ED Triage Notes (Signed)
PCP recommended she come in. Patient received covid and flu shot 10-20. Had a reaction to the vax. Patient still has a rash on right arm at injection site  Headache-congestion-sore throat since Monday.
